# Patient Record
Sex: Female | Born: 1990 | Hispanic: Yes | Marital: Married | Smoking: Never smoker
Health system: Southern US, Community
[De-identification: ages and names within clinical notes are randomized; demographics above are authoritative.]

## PROBLEM LIST (undated history)

## (undated) ENCOUNTER — Inpatient Hospital Stay: Payer: Self-pay

## (undated) DIAGNOSIS — Z8759 Personal history of other complications of pregnancy, childbirth and the puerperium: Secondary | ICD-10-CM

## (undated) DIAGNOSIS — Z789 Other specified health status: Secondary | ICD-10-CM

## (undated) HISTORY — PX: NO PAST SURGERIES: SHX2092

## (undated) HISTORY — DX: Personal history of other complications of pregnancy, childbirth and the puerperium: Z87.59

---

## 2015-05-26 ENCOUNTER — Other Ambulatory Visit: Payer: Self-pay | Admitting: Physician Assistant

## 2015-05-26 DIAGNOSIS — Z369 Encounter for antenatal screening, unspecified: Secondary | ICD-10-CM

## 2015-05-26 LAB — OB RESULTS CONSOLE RUBELLA ANTIBODY, IGM: Rubella: IMMUNE

## 2015-05-26 LAB — OB RESULTS CONSOLE VARICELLA ZOSTER ANTIBODY, IGG: Varicella: IMMUNE

## 2015-05-27 LAB — OB RESULTS CONSOLE VARICELLA ZOSTER ANTIBODY, IGG: VARICELLA IGG: IMMUNE

## 2015-05-27 LAB — OB RESULTS CONSOLE HEPATITIS B SURFACE ANTIGEN: Hepatitis B Surface Ag: NEGATIVE

## 2015-05-27 LAB — OB RESULTS CONSOLE RUBELLA ANTIBODY, IGM: RUBELLA: IMMUNE

## 2015-06-09 ENCOUNTER — Ambulatory Visit
Admission: RE | Admit: 2015-06-09 | Discharge: 2015-06-09 | Disposition: A | Discharge status: Home / Self Care | Payer: Medicaid Other | Source: Ambulatory Visit | Attending: Maternal & Fetal Medicine | Admitting: Maternal & Fetal Medicine

## 2015-06-09 DIAGNOSIS — O209 Hemorrhage in early pregnancy, unspecified: Secondary | ICD-10-CM | POA: Insufficient documentation

## 2015-06-09 DIAGNOSIS — Z36 Encounter for antenatal screening of mother: Secondary | ICD-10-CM

## 2015-06-09 DIAGNOSIS — Z369 Encounter for antenatal screening, unspecified: Secondary | ICD-10-CM

## 2015-06-09 DIAGNOSIS — IMO0002 Reserved for concepts with insufficient information to code with codable children: Secondary | ICD-10-CM

## 2015-06-09 DIAGNOSIS — Z3A18 18 weeks gestation of pregnancy: Secondary | ICD-10-CM | POA: Insufficient documentation

## 2015-06-09 HISTORY — DX: Other specified health status: Z78.9

## 2015-06-09 NOTE — Progress Notes (Addendum)
Pamela Owen Length of Consultation: 30 minutes   Ms. Pamela Owen  was referred to [facility] for genetic counseling to review prenatal screening and testing options.  This note summarizes the information we discussed with the aid of a Spanish interpreter.    We offered the following routine screening tests for this pregnancy:  First trimester screening, which includes nuchal translucency ultrasound screen and first trimester maternal serum marker screening.  The nuchal translucency has approximately an 80% detection rate for Down syndrome and can be positive for other chromosome abnormalities as well as congenital heart defects.  When combined with a maternal serum marker screening, the detection rate is up to 90% for Down syndrome and up to 97% for trisomy 18.     Maternal serum marker screening, a blood test that measures pregnancy proteins, can provide risk assessments for Down syndrome, trisomy 18, and open neural tube defects (spina bifida, anencephaly). Because it does not directly examine the fetus, it cannot positively diagnose or rule out these problems.  Targeted ultrasound uses high frequency sound waves to create an image of the developing fetus.  An ultrasound is often recommended as a routine means of evaluating the pregnancy.  It is also used to screen for fetal anatomy problems (for example, a heart defect) that might be suggestive of a chromosomal or other abnormality.   Should these screening tests indicate an increased concern, then the following additional testing options would be offered:  The chorionic villus sampling procedure is available for first trimester chromosome analysis.  This involves the withdrawal of a small amount of chorionic villi (tissue from the developing placenta).  Risk of pregnancy loss is estimated to be approximately 1 in 200 to 1 in 100 (0.5 to 1%).  There is approximately a 1% (1 in 100) chance that the CVS chromosome  results will be unclear.  Chorionic villi cannot be tested for neural tube defects.     Amniocentesis involves the removal of a small amount of amniotic fluid from the sac surrounding the fetus with the use of a thin needle inserted through the maternal abdomen and uterus.  Ultrasound guidance is used throughout the procedure.  Fetal cells from amniotic fluid are directly evaluated and > 99.5% of chromosome problems and > 98% of open neural tube defects can be detected. This procedure is generally performed after the 15th week of pregnancy.  The main risks to this procedure include complications leading to miscarriage in less than 1 in 200 cases (0.5%).  As another option for information if the pregnancy is suspected to be an an increased chance for certain chromosome conditions, we also reviewed the availability of cell free fetal DNA testing from maternal blood to determine whether or not the baby may have either Down syndrome, trisomy 76, or trisomy 53.  This test utilizes a maternal blood sample and DNA sequencing technology to isolate circulating cell free fetal DNA from maternal plasma.  The fetal DNA can then be analyzed for DNA sequences that are derived from the three most common chromosomes involved in aneuploidy, chromosomes 13, 18, and 21.  If the overall amount of DNA is greater than the expected level for any of these chromosomes, aneuploidy is suspected.  While we do not consider it a replacement for invasive testing and karyotype analysis, a negative result from this testing would be reassuring, though not a guarantee of a normal chromosome complement for the baby.  An abnormal result is certainly suggestive of an  abnormal chromosome complement, though we would still recommend CVS or amniocentesis to confirm any findings from this testing.  We obtained a detailed family history and pregnancy history.  The family history was reported to be unremarkable for birth defects, mental retardation,  recurrent pregnancy loss or known chromosome abnormalities.  Ms. Pamela Owen stated that this is the second pregnancy with her partner, the first ended in early miscarriage.  She reported no complications or exposures in the current pregnancy that would be expected to increase the risk for birth defects.  After consideration of the options, Ms. Pamela Owen elected to proceed with an ultrasound only for dating.  She declined first trimester screening.  An ultrasound was performed at the time of the visit.  Please refer to the ultrasound report for details of that study.  Ms. Pamela Owen was encouraged to call with questions or concerns.  We can be contacted at [phone].   Note:  The patient was seen by Pamela GoodpastureKristin Nunez, MS, CGC on 06/09/2015.  Her genetic counseling note from that date apparently did not get saved in the chart and therefore is documented here with her permission.  Pamela Andersoneborah F. Pamela Verdi, MS, CGC    Reviewed Genetic counseling note with Pamela Owen, agree with assessment and plan as outlined.

## 2015-06-24 ENCOUNTER — Emergency Department: Payer: Medicaid Other

## 2015-06-24 ENCOUNTER — Encounter: Payer: Self-pay | Admitting: Emergency Medicine

## 2015-06-24 ENCOUNTER — Emergency Department
Admission: EM | Admit: 2015-06-24 | Discharge: 2015-06-24 | Disposition: A | Discharge status: Home / Self Care | Payer: Medicaid Other | Attending: Emergency Medicine | Admitting: Emergency Medicine

## 2015-06-24 DIAGNOSIS — Z3A13 13 weeks gestation of pregnancy: Secondary | ICD-10-CM | POA: Diagnosis not present

## 2015-06-24 DIAGNOSIS — O4692 Antepartum hemorrhage, unspecified, second trimester: Secondary | ICD-10-CM

## 2015-06-24 DIAGNOSIS — Z79899 Other long term (current) drug therapy: Secondary | ICD-10-CM | POA: Diagnosis not present

## 2015-06-24 DIAGNOSIS — O209 Hemorrhage in early pregnancy, unspecified: Secondary | ICD-10-CM | POA: Insufficient documentation

## 2015-06-24 DIAGNOSIS — O418X10 Other specified disorders of amniotic fluid and membranes, first trimester, not applicable or unspecified: Secondary | ICD-10-CM | POA: Diagnosis not present

## 2015-06-24 LAB — COMPREHENSIVE METABOLIC PANEL
ALT: 21 U/L (ref 14–54)
AST: 20 U/L (ref 15–41)
Albumin: 4.1 g/dL (ref 3.5–5.0)
Alkaline Phosphatase: 57 U/L (ref 38–126)
Anion gap: 5 (ref 5–15)
BUN: 6 mg/dL (ref 6–20)
CHLORIDE: 105 mmol/L (ref 101–111)
CO2: 25 mmol/L (ref 22–32)
CREATININE: 0.44 mg/dL (ref 0.44–1.00)
Calcium: 9.2 mg/dL (ref 8.9–10.3)
GFR calc non Af Amer: >60 mL/min (ref >60)
Glucose, Bld: 86 mg/dL (ref 65–99)
POTASSIUM: 3.7 mmol/L (ref 3.5–5.1)
SODIUM: 135 mmol/L (ref 135–145)
Total Bilirubin: 1 mg/dL (ref 0.3–1.2)
Total Protein: 7.3 g/dL (ref 6.5–8.1)

## 2015-06-24 LAB — URINALYSIS COMPLETE WITH MICROSCOPIC (ARMC ONLY)
Bilirubin Urine: NEGATIVE
Glucose, UA: NEGATIVE mg/dL
Ketones, ur: NEGATIVE mg/dL
Leukocytes, UA: NEGATIVE
NITRITE: NEGATIVE
PH: 7 (ref 5.0–8.0)
PROTEIN: NEGATIVE mg/dL
SPECIFIC GRAVITY, URINE: 1.005 (ref 1.005–1.030)

## 2015-06-24 LAB — HCG, QUANTITATIVE, PREGNANCY: hCG, Beta Chain, Quant, S: 79538 m[IU]/mL — ABNORMAL HIGH (ref <5)

## 2015-06-24 LAB — CBC
HCT: 35.3 % (ref 35.0–47.0)
Hemoglobin: 12.3 g/dL (ref 12.0–16.0)
MCH: 30.5 pg (ref 26.0–34.0)
MCHC: 34.7 g/dL (ref 32.0–36.0)
MCV: 87.9 fL (ref 80.0–100.0)
PLATELETS: 199 10*3/uL (ref 150–440)
RBC: 4.02 MIL/uL (ref 3.80–5.20)
RDW: 12.6 % (ref 11.5–14.5)
WBC: 10.7 10*3/uL (ref 3.6–11.0)

## 2015-06-24 LAB — ABO/RH: ABO/RH(D): O POS

## 2015-06-24 NOTE — ED Notes (Signed)
Pt returned from US

## 2015-06-24 NOTE — ED Provider Notes (Signed)
[facility] Emergency Department Provider Note  ____________________________________________  Time seen: 3:30 AM  I have reviewed the triage vital signs and the nursing notes.   HISTORY  Chief Complaint Vaginal Bleeding    HPI Pamela Owen Denny Levy is a 24 y.o. female G2 P0 1 previous miscarriage approximately [redacted] weeks pregnant presents with acute onset of vaginal bleeding times "couple hours". He states that she's use one pad in the past hour. Patient also admits to pelvic cramping. Patient denies any fever no nausea or vomiting.     Past Medical History  Diagnosis Date  . Medical history non-contributory     There are no active problems to display for this patient.   Past Surgical History  Procedure Laterality Date  . No past surgeries      Current Outpatient Rx  Name  Route  Sig  Dispense  Refill  . Prenatal Vit-Fe Fumarate-FA (PRENATAL MULTIVITAMIN) TABS tablet   Oral   Take 1 tablet by mouth daily at 12 noon.           Allergies Other  History reviewed. No pertinent family history.  Social History Social History  Substance Use Topics  . Smoking status: Never Smoker   . Smokeless tobacco: Never Used  . Alcohol Use: No    Review of Systems  Constitutional: Negative for fever. Eyes: Negative for visual changes. ENT: Negative for sore throat. Cardiovascular: Negative for chest pain. Respiratory: Negative for shortness of breath. Gastrointestinal: Negative for abdominal pain, vomiting and diarrhea. Genitourinary: Negative for dysuria. Positive for pelvic pain and vaginal bleeding Musculoskeletal: Negative for back pain. Skin: Negative for rash. Neurological: Negative for headaches, focal weakness or numbness.  10-point ROS otherwise negative.  ____________________________________________   PHYSICAL EXAM:  VITAL SIGNS: ED Triage Vitals  Enc Vitals Group     BP 06/24/15 0251 108/65 mmHg     Pulse Rate  06/24/15 0251 84     Resp 06/24/15 0251 20     Temp 06/24/15 0251 98.6 F (37 C)     Temp Source 06/24/15 0251 Oral     SpO2 06/24/15 0251 100 %     Weight 06/24/15 0251 145 lb (65.772 kg)     Height 06/24/15 0251  (1.651 m)     Head Cir --      Peak Flow --      Pain Score 06/24/15 0253 0     Pain Loc --      Pain Edu? --      Excl. in GC? --      Constitutional: Alert and oriented. Well appearing and in no distress. Eyes: Conjunctivae are normal. PERRL. Normal extraocular movements. ENT   Head: Normocephalic and atraumatic.   Nose: No congestion/rhinnorhea.   Mouth/Throat: Mucous membranes are moist.   Neck: No stridor. Hematological/Lymphatic/Immunilogical: No cervical lymphadenopathy. Cardiovascular: Normal rate, regular rhythm. Normal and symmetric distal pulses are present in all extremities. No murmurs, rubs, or gallops. Respiratory: Normal respiratory effort without tachypnea nor retractions. Breath sounds are clear and equal bilaterally. No wheezes/rales/rhonchi. Gastrointestinal: Soft and nontender. No distention. There is no CVA tenderness. Genitourinary: deferred Musculoskeletal: Nontender with normal range of motion in all extremities. No joint effusions.  No lower extremity tenderness nor edema. Neurologic:  Normal speech and language. No gross focal neurologic deficits are appreciated. Speech is normal.  Skin:  Skin is warm, dry and intact. No rash noted. Psychiatric: Mood and affect are normal. Speech and behavior are normal. Patient exhibits  appropriate insight and judgment.  ____________________________________________    LABS (pertinent positives/negatives)  Labs Reviewed  HCG, QUANTITATIVE, PREGNANCY - Abnormal; Notable for the following:    hCG, Beta Chain, [place] LongestQuant, S [id number] (*)    All other components within normal limits  URINALYSIS COMPLETEWITH MICROSCOPIC ([facility] ONLY) - Abnormal; Notable for the following:    Color, Urine STRAW (*)     APPearance CLEAR (*)    Hgb urine dipstick 3+ (*)    Bacteria, UA RARE (*)    Squamous Epithelial / LPF 0-5 (*)    All other components within normal limits  CBC  COMPREHENSIVE METABOLIC PANEL  ABO/RH         RADIOLOGY  US OB Comp Less 14 Wks (Final result) Result time: 06/24/15 05:58:27   Final result by Rad Results In Interface (06/24/15 05:58:27)   Narrative:   CLINICAL DATA: Acute onset of vaginal bleeding and pelvic pain. Initial encounter.  EXAM: OBSTETRIC <14 WK ULTRASOUND  TECHNIQUE: Transabdominal ultrasound was performed for evaluation of the gestation as well as the maternal uterus and adnexal regions.  COMPARISON: Pelvic ultrasound performed 06/09/2015  FINDINGS: Intrauterine gestational sac: Visualized/normal in shape.  Yolk sac: Yes  Embryo: Yes  Cardiac Activity: Yes  Heart Rate: 160 bpm  CRL:  7.53 cm  13 w 4 d         US EDC: 12/26/2015  Subchorionic hemorrhage: A small amount of subchorionic hemorrhage is noted.  Maternal uterus/adnexae: The uterus is otherwise unremarkable in appearance.  The ovaries are within normal limits. The right ovary measures 3.3 x 2.2 x 2.4 cm, while the left ovary measures 2.8 x 1.5 x 1.6 cm. No suspicious adnexal masses are seen; there is no evidence for ovarian torsion.  No free fluid is seen within the pelvic cul-de-sac.  IMPRESSION: 1. Single live intrauterine pregnancy noted, with a crown-rump length of 7.5 cm, corresponding to a gestational age of [redacted] weeks 4 days. This matches the gestational age of [redacted] weeks 5 days by the first ultrasound, reflecting an estimated date of delivery of December 25, 2015. 2. Small amount of subchorionic hemorrhage noted.   Electronically Signed By: Roanna RaiderJeffery Chang M.D. On: 06/24/2015 05:58        ECG Results        INITIAL IMPRESSION / ASSESSMENT AND PLAN / ED COURSE  Pertinent labs & imaging results that were available during my care  of the patient were reviewed by me and considered in my medical decision making (see chart for details).    ____________________________________________   FINAL CLINICAL IMPRESSION(S) / ED DIAGNOSES  Final diagnoses:  Vaginal bleeding in pregnancy, second trimester  Subchorionic Hemorrhage    Darci Currentandolph N Linus Weckerly, MD 06/28/15 [phone]

## 2015-06-24 NOTE — ED Notes (Signed)
Woke up tonight with vaginal bleeding. Is [redacted] weeks pregnant. States she is still bleeding. Unable to say how many pads.

## 2015-06-24 NOTE — ED Notes (Signed)
Pt taken to US

## 2015-06-24 NOTE — Discharge Instructions (Signed)
Hemorragia vaginal durante el embarazo (segundo trimestre) (Vaginal Bleeding During Pregnancy, Second Trimester) Durante el embarazo es relativamente frecuente que se presente una pequea hemorragia (manchas). Esta situacin generalmente mejora por s misma. Hay diversos factores que pueden causar hemorragia o manchas en el embarazo. Algunas manchas pueden estar relacionadas al embarazo y otras no. A veces, la hemorragia es normal y no es un problema. Sin embargo, la hemorragia tambin puede ser un signo de algo grave. Debe informar a su mdico de inmediato si tiene alguna hemorragia vaginal. Algunas causas posibles de hemorragia vaginal durante el segundo trimestre incluyen:  Infeccin, inflamacin o crecimientos anormales en el cuello del tero.  La placenta cubre parcial o completamente la abertura del cuello del tero (placenta previa).  La placenta puede haberse separado del tero (abrupcin placentaria).  Usted puede estar sufriendo un trabajo de parto prematuro.  Es probable que el cuello del tero no sea lo suficientemente resistente para mantener un beb dentro del tero (insuficiencia cervical).  Se pueden haber desarrollado pequeos quistes en el tero en lugar de tejido de embarazo (embarazo molar). INSTRUCCIONES PARA EL CUIDADO EN EL HOGAR  Controle su afeccin para ver si hay cambios. Las siguientes indicaciones ayudarn a aliviar cualquier molestia que pueda sentir:  Siga las indicaciones del mdico para restringir su actividad. Si el mdico le indica descanso en la cama, debe quedarse en la cama y levantarse solo para ir al bao. No obstante, el mdico puede permitirle que continu con tareas livianas.  Si es necesario, organcese para que alguien le ayude con las actividades y responsabilidades cotidianas mientras est en cama.  Lleve un registro de la cantidad y la saturacin de las toallas higinicas que utiliza cada da. Anote este dato.  No use tampones.No se haga duchas  vaginales.  No tenga relaciones sexuales u orgasmos hasta que el mdico la autorice.  Si elimina tejido por la vagina, gurdelo para mostrrselo al mdico.  Tome solo medicamentos de venta libre o recetados, segn las indicaciones del mdico.  No tome aspirina, ya que puede causar hemorragias.  No realice ejercicio ni ninguna actividad intensa, ni levante objetos pesados sin el permiso de su mdico.  Cumpla con todas las visitas de control, segn le indique su mdico. SOLICITE ATENCIN MDICA SI:  Tiene un sangrado vaginal en cualquier momento del embarazo.  Tiene calambres o dolores de parto.  Tiene fiebre que los medicamentos no pueden controlar. SOLICITE ATENCIN MDICA DE INMEDIATO SI:   Siente calambres intensos en la espalda o en el vientre (abdomen).  Siente contracciones.  Tiene escalofros.  Elimina cogulos grandes o tejido por la vagina.  La hemorragia aumenta.  Si se siente mareada, dbil o se desmaya.  Tiene una prdida importante o sale lquido a borbotones por la vagina. ASEGRESE DE QUE:  Comprende estas instrucciones.  Controlar su afeccin.  Recibir ayuda de inmediato si no mejora o si empeora.   Esta informacin no tiene como fin reemplazar el consejo del mdico. Asegrese de hacerle al mdico cualquier pregunta que tenga.   Document Released: 03/21/2005 Document Revised: 06/16/2013 Elsevier Interactive Patient Education 2016 Elsevier Inc.  

## 2015-06-24 NOTE — ED Notes (Addendum)
Pt reports vaginal bleeding x couple hours.  Pt reports being [redacted] weeks pregnant.  Pt reports pain in lower abd rated 2/10, unable to described.  Pt reports pain worse with movement, "like the baby is moving".  Pt reports using one pad in the past hour.  Pt reports HA, but no dizziness or lightheadedness.  Pt reports second pregnancy, first resulted in miscarriage.  Pt NAD at this time.    Assessment completed with help of tele-interpreter, mario.

## 2015-06-24 NOTE — ED Notes (Signed)
Discharge instructions reviewed with help of tele-interpreter, Milderd Meagerriana.

## 2015-06-26 NOTE — L&D Delivery Note (Signed)
Delivery Note At 4:26 PM a viable  female was delivered via Vaginal, Spontaneous Delivery (Presentation: OA;  ).  APGAR: 8, 9; weight  .   Placenta status: Intact, Spontaneous. See notes regarding placenta Cord: 3 vessels with the following complications: .    Anesthesia: Epidural  Episiotomy: None Lacerations:  2nd degree lac Suture Repair: 3.0 chromic Est. Blood Loss (mL):    Mom to postpartum.  Baby to Couplet care / Skin to Skin.  Sharee PimpleCaron W Jones 01/03/2016, 5:13 PM

## 2015-07-01 MED ORDER — FAMOTIDINE IN NACL 20-0.9 MG/50ML-% IV SOLN
INTRAVENOUS | Status: AC
  Filled 2015-07-01: qty 50

## 2015-07-01 MED ORDER — METHYLPREDNISOLONE SODIUM SUCC 125 MG IJ SOLR
INTRAMUSCULAR | Status: AC
  Filled 2015-07-01: qty 2

## 2015-07-25 ENCOUNTER — Ambulatory Visit
Admission: RE | Admit: 2015-07-25 | Discharge: 2015-07-25 | Disposition: A | Discharge status: Home / Self Care | Payer: Self-pay | Source: Ambulatory Visit | Attending: Obstetrics & Gynecology | Admitting: Obstetrics & Gynecology

## 2015-07-25 ENCOUNTER — Other Ambulatory Visit: Payer: Self-pay

## 2015-07-25 ENCOUNTER — Other Ambulatory Visit: Payer: Self-pay | Admitting: Maternal & Fetal Medicine

## 2015-07-25 DIAGNOSIS — O359XX0 Maternal care for (suspected) fetal abnormality and damage, unspecified, not applicable or unspecified: Secondary | ICD-10-CM

## 2015-07-25 NOTE — Addendum Note (Signed)
Encounter addended by: Katrina Stack on: 07/25/2015 11:36 AM<BR>     Documentation filed: Notes Section

## 2015-10-06 LAB — OB RESULTS CONSOLE HIV ANTIBODY (ROUTINE TESTING): HIV: NONREACTIVE

## 2015-10-07 LAB — OB RESULTS CONSOLE RPR: RPR: NONREACTIVE

## 2015-12-03 LAB — OB RESULTS CONSOLE GBS: STREP GROUP B AG: POSITIVE

## 2016-01-01 ENCOUNTER — Observation Stay
Admission: EM | Admit: 2016-01-01 | Discharge: 2016-01-01 | Disposition: A | Discharge status: Home / Self Care | Payer: MEDICAID | Attending: Obstetrics & Gynecology | Admitting: Obstetrics & Gynecology

## 2016-01-01 DIAGNOSIS — O471 False labor at or after 37 completed weeks of gestation: Principal | ICD-10-CM | POA: Insufficient documentation

## 2016-01-01 DIAGNOSIS — Z91018 Allergy to other foods: Secondary | ICD-10-CM | POA: Insufficient documentation

## 2016-01-01 DIAGNOSIS — Z3A41 41 weeks gestation of pregnancy: Secondary | ICD-10-CM | POA: Insufficient documentation

## 2016-01-01 NOTE — Discharge Summary (Addendum)
Pamela Owen is a 25 y.o. female. She is at 3555w0d gestation.  Chief Complaint: contractions  S: Resting comfortably. mild CTX, scant VB.no LOF,  Active fetal movement.   Contractions began 4 days ago and have been gaining in frequency and strength, she rates them as 8/10 currently.  The occur every 5 minute or so, and last 45 seconds.  She has some mucousy discharge.    Maternal Medical History:   Past Medical History  Diagnosis Date  . Medical history non-contributory     Past Surgical History  Procedure Laterality Date  . No past surgeries      Allergies  Allergen Reactions  . Other Itching    Spicy foods, specifically chilles    Prior to Admission medications   Medication Sig Start Date End Date Taking? Authorizing Provider  Prenatal Vit-Fe Fumarate-FA (PRENATAL MULTIVITAMIN) TABS tablet Take 1 tablet by mouth daily at 12 noon.    Historical Provider, MD     Pren[facility]  Social History: She  reports that she has never smoked. She has never used smokeless tobacco. She reports that she does not drink alcohol or use illicit drugs.  Family History: family history is not on file.   Review of Systems: A full review of systems was performed and negative except as noted in the HPI.     O:  BP 129/77 mmHg  Pulse 92  Temp(Src) 98 F (36.7 C) (Oral)  Resp 16  Ht 5' 6.93" (1.7 m)  Wt 81.647 kg (180 lb)  BMI 28.25 kg/m2  LMP  (LMP Unknown) No results found for this or any previous visit (from the past 48 hour(s)).   Constitutional: NAD, AAOx3  HE/ENT: extraocular movements grossly intact, moist mucous membranes CV: RRR PULM: nl respiratory effort, CTABL     Abd: gravid, non-tender, non-distended, soft      Ext: Non-tender, Nonedmeatous   Psych: mood appropriate, speech normal Pelvic: 1/80/-3. Posterior, soft  FHT: 135 mod + accels no decels TOCO: quiet    A/P:  25yo G2P0010 @ 41.0 with rule out labor.    Labor: not present.   Fetal Wellbeing: Reassuring Cat 1 tracing.  NST reactive  D/c home stable, precautions reviewed, scheduled for IOL tomorrow night.  Return then unless labor occurs.  ----- Ranae Plumberhelsea Ward, MD Attending Obstetrician and Gynecologist [facility]

## 2016-01-01 NOTE — OB Triage Note (Signed)
Ms. Pamela Owen here with c/o ctx that began Friday, became stronger today, reports ctx approx 3 per hour, reports blood in her discharge, positive fetal movement

## 2016-01-02 ENCOUNTER — Inpatient Hospital Stay
Admission: EM | Admit: 2016-01-02 | Discharge: 2016-01-05 | DRG: 775 | Disposition: A | Discharge status: Home / Self Care | Payer: Medicaid Other | Attending: Obstetrics and Gynecology | Admitting: Obstetrics and Gynecology

## 2016-01-02 DIAGNOSIS — O480 Post-term pregnancy: Principal | ICD-10-CM | POA: Diagnosis present

## 2016-01-02 DIAGNOSIS — O99824 Streptococcus B carrier state complicating childbirth: Secondary | ICD-10-CM | POA: Diagnosis present

## 2016-01-02 DIAGNOSIS — Z3A41 41 weeks gestation of pregnancy: Secondary | ICD-10-CM | POA: Diagnosis not present

## 2016-01-02 DIAGNOSIS — Z23 Encounter for immunization: Secondary | ICD-10-CM | POA: Diagnosis not present

## 2016-01-02 DIAGNOSIS — Z3493 Encounter for supervision of normal pregnancy, unspecified, third trimester: Secondary | ICD-10-CM

## 2016-01-02 LAB — CHLAMYDIA/NGC RT PCR (ARMC ONLY)
Chlamydia Tr: NOT DETECTED
N GONORRHOEAE: NOT DETECTED

## 2016-01-02 LAB — CBC
HCT: 38.5 % (ref 35.0–47.0)
Hemoglobin: 13.6 g/dL (ref 12.0–16.0)
MCH: 31.8 pg (ref 26.0–34.0)
MCHC: 35.4 g/dL (ref 32.0–36.0)
MCV: 89.8 fL (ref 80.0–100.0)
PLATELETS: 228 10*3/uL (ref 150–440)
RBC: 4.28 MIL/uL (ref 3.80–5.20)
RDW: 13.6 % (ref 11.5–14.5)
WBC: 15.8 10*3/uL — AB (ref 3.6–11.0)

## 2016-01-02 LAB — OB RESULTS CONSOLE GC/CHLAMYDIA
CHLAMYDIA, DNA PROBE: NEGATIVE
Gonorrhea: NEGATIVE

## 2016-01-02 LAB — TYPE AND SCREEN
ABO/RH(D): O POS
Antibody Screen: NEGATIVE

## 2016-01-02 LAB — RAPID HIV SCREEN (HIV 1/2 AB+AG)
HIV 1/2 Antibodies: NONREACTIVE
HIV-1 P24 Antigen - HIV24: NONREACTIVE

## 2016-01-02 MED ORDER — ONDANSETRON HCL 4 MG/2ML IJ SOLN: 4.0000 mg | Freq: Four times a day (QID) | INTRAMUSCULAR | Status: DC | PRN

## 2016-01-02 MED ORDER — LIDOCAINE HCL (PF) 1 % IJ SOLN
INTRAMUSCULAR | Status: AC
  Administered 2016-01-03: 30 mL via SUBCUTANEOUS
  Filled 2016-01-02: qty 30

## 2016-01-02 MED ORDER — BUTORPHANOL TARTRATE 1 MG/ML IJ SOLN
1.0000 mg | INTRAMUSCULAR | Status: DC | PRN
  Administered 2016-01-03 (×2): 1 mg via INTRAVENOUS
  Filled 2016-01-02 (×2): qty 1

## 2016-01-02 MED ORDER — SOD CITRATE-CITRIC ACID 500-334 MG/5ML PO SOLN: 30.0000 mL | ORAL | Status: DC | PRN

## 2016-01-02 MED ORDER — OXYCODONE-ACETAMINOPHEN 5-325 MG PO TABS
1.0000 | ORAL_TABLET | ORAL | Status: DC | PRN
  Administered 2016-01-03: 1 via ORAL
  Filled 2016-01-02: qty 1

## 2016-01-02 MED ORDER — OXYTOCIN 40 UNITS IN LACTATED RINGERS INFUSION - SIMPLE MED
2.5000 [IU]/h | INTRAVENOUS | Status: DC
  Administered 2016-01-03: 2.5 [IU]/h via INTRAVENOUS

## 2016-01-02 MED ORDER — OXYTOCIN 10 UNIT/ML IJ SOLN
INTRAMUSCULAR | Status: AC
  Filled 2016-01-02: qty 2

## 2016-01-02 MED ORDER — PENICILLIN G POTASSIUM 5000000 UNITS IJ SOLR
5.0000 10*6.[IU] | Freq: Once | INTRAVENOUS | Status: AC
  Administered 2016-01-03: 5 10*6.[IU] via INTRAVENOUS
  Filled 2016-01-02: qty 5

## 2016-01-02 MED ORDER — OXYCODONE-ACETAMINOPHEN 5-325 MG PO TABS: 2.0000 | ORAL_TABLET | ORAL | Status: DC | PRN

## 2016-01-02 MED ORDER — LACTATED RINGERS IV SOLN
INTRAVENOUS | Status: DC
  Administered 2016-01-02 – 2016-01-03 (×4): via INTRAVENOUS

## 2016-01-02 MED ORDER — LACTATED RINGERS IV SOLN: 500.0000 mL | INTRAVENOUS | Status: DC | PRN

## 2016-01-02 MED ORDER — AMMONIA AROMATIC IN INHA
RESPIRATORY_TRACT | Status: AC
  Filled 2016-01-02: qty 10

## 2016-01-02 MED ORDER — ACETAMINOPHEN 325 MG PO TABS: 650.0000 mg | ORAL_TABLET | ORAL | Status: DC | PRN

## 2016-01-02 MED ORDER — MISOPROSTOL 200 MCG PO TABS
ORAL_TABLET | ORAL | Status: AC
  Filled 2016-01-02: qty 4

## 2016-01-02 MED ORDER — PENICILLIN G POTASSIUM 5000000 UNITS IJ SOLR
2.5000 10*6.[IU] | INTRAVENOUS | Status: DC
  Administered 2016-01-03: 2.5 10*6.[IU] via INTRAVENOUS
  Filled 2016-01-02 (×10): qty 2.5

## 2016-01-02 MED ORDER — OXYTOCIN 40 UNITS IN LACTATED RINGERS INFUSION - SIMPLE MED
1.0000 m[IU]/min | INTRAVENOUS | Status: DC
  Administered 2016-01-02: 1 m[IU]/min via INTRAVENOUS
  Filled 2016-01-02: qty 1000

## 2016-01-02 MED ORDER — LIDOCAINE HCL (PF) 1 % IJ SOLN
30.0000 mL | INTRAMUSCULAR | Status: DC | PRN
  Administered 2016-01-03: 30 mL via SUBCUTANEOUS

## 2016-01-02 MED ORDER — OXYTOCIN BOLUS FROM INFUSION
500.0000 mL | INTRAVENOUS | Status: DC
  Administered 2016-01-03: 500 mL via INTRAVENOUS

## 2016-01-03 ENCOUNTER — Inpatient Hospital Stay: Payer: Medicaid Other | Admitting: Anesthesiology

## 2016-01-03 ENCOUNTER — Encounter: Payer: Self-pay | Admitting: Anesthesiology

## 2016-01-03 MED ORDER — MEASLES, MUMPS & RUBELLA VAC ~~LOC~~ INJ: 0.5000 mL | INJECTION | Freq: Once | SUBCUTANEOUS | Status: AC

## 2016-01-03 MED ORDER — SODIUM CHLORIDE 0.9 % IV SOLN
INTRAVENOUS | Status: DC | PRN
  Administered 2016-01-03 (×2): 5 mL via EPIDURAL

## 2016-01-03 MED ORDER — SODIUM CHLORIDE 0.9 % IV SOLN: 250.0000 mL | INTRAVENOUS | Status: DC | PRN

## 2016-01-03 MED ORDER — DIPHENHYDRAMINE HCL 50 MG/ML IJ SOLN: 12.5000 mg | INTRAMUSCULAR | Status: DC | PRN

## 2016-01-03 MED ORDER — ONDANSETRON HCL 4 MG/2ML IJ SOLN: 4.0000 mg | Freq: Three times a day (TID) | INTRAMUSCULAR | Status: DC | PRN

## 2016-01-03 MED ORDER — SODIUM CHLORIDE 0.9% FLUSH: 3.0000 mL | INTRAVENOUS | Status: DC | PRN

## 2016-01-03 MED ORDER — ACETAMINOPHEN 325 MG PO TABS: 650.0000 mg | ORAL_TABLET | ORAL | Status: DC | PRN

## 2016-01-03 MED ORDER — NALBUPHINE HCL 10 MG/ML IJ SOLN: 5.0000 mg | Freq: Once | INTRAMUSCULAR | Status: DC | PRN

## 2016-01-03 MED ORDER — TETANUS-DIPHTH-ACELL PERTUSSIS 5-2.5-18.5 LF-MCG/0.5 IM SUSP: 0.5000 mL | Freq: Once | INTRAMUSCULAR | Status: AC

## 2016-01-03 MED ORDER — KETOROLAC TROMETHAMINE 30 MG/ML IJ SOLN: 30.0000 mg | Freq: Four times a day (QID) | INTRAMUSCULAR | Status: DC | PRN

## 2016-01-03 MED ORDER — ZOLPIDEM TARTRATE 5 MG PO TABS: 5.0000 mg | ORAL_TABLET | Freq: Every evening | ORAL | Status: DC | PRN

## 2016-01-03 MED ORDER — ONDANSETRON HCL 4 MG PO TABS: 4.0000 mg | ORAL_TABLET | ORAL | Status: DC | PRN

## 2016-01-03 MED ORDER — BISACODYL 10 MG RE SUPP: 10.0000 mg | Freq: Every day | RECTAL | Status: DC | PRN

## 2016-01-03 MED ORDER — MEPERIDINE HCL 25 MG/ML IJ SOLN: 6.2500 mg | INTRAMUSCULAR | Status: DC | PRN

## 2016-01-03 MED ORDER — IBUPROFEN 600 MG PO TABS
600.0000 mg | ORAL_TABLET | Freq: Four times a day (QID) | ORAL | Status: DC
  Administered 2016-01-03 – 2016-01-04 (×4): 600 mg via ORAL
  Filled 2016-01-03 (×5): qty 1

## 2016-01-03 MED ORDER — BENZOCAINE-MENTHOL 20-0.5 % EX AERO: 1.0000 "application " | INHALATION_SPRAY | CUTANEOUS | Status: DC | PRN

## 2016-01-03 MED ORDER — NALBUPHINE HCL 10 MG/ML IJ SOLN: 5.0000 mg | INTRAMUSCULAR | Status: DC | PRN

## 2016-01-03 MED ORDER — SODIUM CHLORIDE FLUSH 0.9 % IV SOLN
INTRAVENOUS | Status: AC
  Filled 2016-01-03: qty 30

## 2016-01-03 MED ORDER — FENTANYL 2.5 MCG/ML W/ROPIVACAINE 0.2% IN NS 100 ML EPIDURAL INFUSION (ARMC-ANES)
EPIDURAL | Status: AC
  Administered 2016-01-03: 10 mL/h via EPIDURAL
  Filled 2016-01-03: qty 100

## 2016-01-03 MED ORDER — LIDOCAINE-EPINEPHRINE (PF) 1.5 %-1:200000 IJ SOLN
INTRAMUSCULAR | Status: DC | PRN
  Administered 2016-01-03: 3 mL via EPIDURAL

## 2016-01-03 MED ORDER — SENNOSIDES-DOCUSATE SODIUM 8.6-50 MG PO TABS
2.0000 | ORAL_TABLET | ORAL | Status: DC
  Administered 2016-01-04 (×2): 2 via ORAL
  Filled 2016-01-03 (×2): qty 2

## 2016-01-03 MED ORDER — KETOROLAC TROMETHAMINE 30 MG/ML IJ SOLN
30.0000 mg | Freq: Four times a day (QID) | INTRAMUSCULAR | Status: DC | PRN
Start: 2016-01-03 — End: 2016-01-04

## 2016-01-03 MED ORDER — DIBUCAINE 1 % RE OINT: 1.0000 "application " | TOPICAL_OINTMENT | RECTAL | Status: DC | PRN

## 2016-01-03 MED ORDER — FLEET ENEMA 7-19 GM/118ML RE ENEM: 1.0000 | ENEMA | Freq: Every day | RECTAL | Status: DC | PRN

## 2016-01-03 MED ORDER — FENTANYL 2.5 MCG/ML W/ROPIVACAINE 0.2% IN NS 100 ML EPIDURAL INFUSION (ARMC-ANES)
10.0000 mL/h | EPIDURAL | Status: DC
  Administered 2016-01-03: 10 mL/h via EPIDURAL

## 2016-01-03 MED ORDER — NALOXONE HCL 2 MG/2ML IJ SOSY
1.0000 ug/kg/h | PREFILLED_SYRINGE | INTRAVENOUS | Status: DC | PRN
  Filled 2016-01-03: qty 2

## 2016-01-03 MED ORDER — LIDOCAINE HCL (PF) 1 % IJ SOLN
INTRAMUSCULAR | Status: DC | PRN
  Administered 2016-01-03: 3 mL via SUBCUTANEOUS

## 2016-01-03 MED ORDER — ONDANSETRON HCL 4 MG/2ML IJ SOLN: 4.0000 mg | INTRAMUSCULAR | Status: DC | PRN

## 2016-01-03 MED ORDER — WITCH HAZEL-GLYCERIN EX PADS: 1.0000 "application " | MEDICATED_PAD | CUTANEOUS | Status: DC | PRN

## 2016-01-03 MED ORDER — COCONUT OIL OIL
1.0000 "application " | TOPICAL_OIL | Status: DC | PRN
  Administered 2016-01-04: 1 via TOPICAL
  Filled 2016-01-03: qty 120

## 2016-01-03 MED ORDER — DIPHENHYDRAMINE HCL 25 MG PO CAPS: 25.0000 mg | ORAL_CAPSULE | ORAL | Status: DC | PRN

## 2016-01-03 MED ORDER — OXYCODONE HCL 5 MG PO TABS: 5.0000 mg | ORAL_TABLET | ORAL | Status: DC | PRN

## 2016-01-03 MED ORDER — SIMETHICONE 80 MG PO CHEW: 80.0000 mg | CHEWABLE_TABLET | ORAL | Status: DC | PRN

## 2016-01-03 MED ORDER — PRENATAL MULTIVITAMIN CH
1.0000 | ORAL_TABLET | Freq: Every day | ORAL | Status: DC
  Administered 2016-01-04: 1 via ORAL
  Filled 2016-01-03: qty 1

## 2016-01-03 MED ORDER — SODIUM CHLORIDE 0.9% FLUSH: 3.0000 mL | Freq: Two times a day (BID) | INTRAVENOUS | Status: DC

## 2016-01-03 MED ORDER — NALOXONE HCL 0.4 MG/ML IJ SOLN: 0.4000 mg | INTRAMUSCULAR | Status: DC | PRN

## 2016-01-03 NOTE — Progress Notes (Signed)
S: Resting comfortably.. + CTX, no LOF, VB O: Filed Vitals:   01/03/16 0719 01/03/16 0724 01/03/16 0729 01/03/16 0734  BP:      Pulse:      Temp:      TempSrc:      Resp:      Height:      Weight:      SpO2: 99% 99% 99% 100%     Gen: NAD, AAOx3      Abd: FNTTP      Ext: Non-tender, Nonedmeatous    FHT: + mod var + accelerations no decelerations TOCO: Q 2-21/2 min SVE: 5/80/vtx   A/P:  25 y.o. yo G2P0010 at 2786w2d for IOL with PItocin   Labor: progressing  FWB: Reassuring Cat 1 tracing.   GBS: pos (Antibiotics started)    Sharee Pimplearon W Jones 8:10 AM

## 2016-01-03 NOTE — Progress Notes (Signed)
S: Resting comfortably with epidural. + CTX, no LOF, VB O: Filed Vitals:   01/03/16 0907 01/03/16 1007 01/03/16 1107 01/03/16 1207  BP: 107/66 114/65 109/74 110/70  Pulse: 82 84 77 75  Temp:      TempSrc:      Resp:  18 18   Height:      Weight:      SpO2:         Gen: NAD, AAOx3      Abd: FNTTP      Ext: Non-tender, Nonedmeatous    FHT:+ mod var + accelerations, early decelerations TOCO: Q2-2.5 min SVE: 7/90/vtx-1   A/P:  25 y.o. yo G2P0010 at 3943w2d for post-dates IOL.   Labor: progressing  FWB: Reassuring Cat 1 tracing.   GBS:pos  P: Continue Pitocin drip at 6 mun/min and increase per protocol  Continue fetal and uterine monitoring  Monitor vital signs.  Antic SVD  Sharee Pimplearon W Linnae Rasool 12:49 PM

## 2016-01-03 NOTE — Progress Notes (Signed)
VAGINAL DELIVERY NOTE:  Date of Delivery: 01/03/2016 Primary OB: [facility]  Gestational Age/EDD: 7749w2d 12/25/2015, by Ultrasound Antepartum complications: GBS pos and post-dates IOL Attending Physician: Milon ScoreJones, Briseyda Fehr CNM Delivery Type: spontaneous vaginal delivery  Anesthesia: epidural, local 1% xylocaine for repair Laceration: 2nd degree perineal Episiotomy: none Placenta: spontaneous, there is an abnormality of the cord insertion where it appears to be a velamentous partial cord. One side is exposed with blood vessels in a velamentous position and the other side of the cord is attached like normal. To pathology.  Intrapartum complications: lt meconium seen on ruptured, variable decels, CAN x 1 reduced and then released from Rt arm at delivery Estimated Blood Loss: 300 ml GBS: pos, with antibiotics per protocol Procedure Details: NSVD of viable female in straight OA position with CAN x 1, delivered through the cord. Vtx, anto and post shoulders del with the body without delay.   Baby: Liveborn female, Apgars , weight  #,  oz, baby named Derek MoundRicardo

## 2016-01-03 NOTE — Progress Notes (Signed)
S: Resting comfortably withepidural. + CTX, no LOF, VB O: Filed Vitals:   01/03/16 1207 01/03/16 1215 01/03/16 1307 01/03/16 1356  BP: 110/70  112/68   Pulse: 75  81   Temp:  98.1 F (36.7 C)  98.2 F (36.8 C)  TempSrc:  Oral  Oral  Resp: 18  18   Height:      Weight:      SpO2:       Pushed with UC and baby had a variable so will labor down.   Gen: NAD, AAOx3      Abd: FNTTP      Ext: Non-tender, Nonedmeatous    FHT: + mod var + accelerations no decelerations TOCO: Q2-2.5 min SVE: ant lip   A/P:  25 y.o. yo G2P0010 at 7177w2d for post-dates.   Labor: reassuring strip, on Pitocin making progress  FWB: Reassuring Cat 1 tracing. EFW 6#12oz  GBS: pos   Sharee Pimplearon W Olumide Dolinger 2:37 PM

## 2016-01-03 NOTE — H&P (Signed)
HISTORY AND PHYSICAL Late Entry as H&P not completed on admission  HISTORY OF PRESENT ILLNESS: Ms. Pamela Owen is a 25 y.o. G2P0010 at 3944w2d by LMP consistent with 11 4/7 week ultrasound with a pregnancy complicated by 1st trimester bleeding due to subchorionic hemorrhage presenting for induction of labor. Pt attends [facility] for OB care.   She has Not been having contractions and denies leakage of fluid, vaginal bleeding, or decreased fetal movement.   Pt is Hispanic speaking. Interpreter called.   REVIEW OF SYSTEMS: A complete review of systems was performed and was specifically negative for headache, changes in vision, RUQ pain, shortness of breath, chest pain, lower extremity edema and dysuria.   HISTORY:  Past Medical History  Diagnosis Date  . Medical history non-contributory     Past Surgical History  Procedure Laterality Date  . No past surgeries      No current facility-administered medications on file prior to encounter.   Current Outpatient Prescriptions on File Prior to Encounter  Medication Sig Dispense Refill  . Prenatal Vit-Fe Fumarate-FA (PRENATAL MULTIVITAMIN) TABS tablet Take 1 tablet by mouth daily at 12 noon.       Allergies  Allergen Reactions  . Other Itching    Spicy foods, specifically chilles    OB History  Gravida Para Term Preterm AB SAB TAB Ectopic Multiple Living  2    1 1         # Outcome Date GA Lbr Len/2nd Weight Sex Delivery Anes PTL Lv  2 Current           1 SAB 02/2015              Gynecologic History:none History of Abnormal Pap Smear: not known History of STI: neg  Social History  Substance Use Topics  . Smoking status: Never Smoker   . Smokeless tobacco: Never Used  . Alcohol Use: No    PHYSICAL EXAM: Temp:  [97.7 F (36.5 C)-98.5 F (36.9 C)] 98.5 F (36.9 C) (07/11 1614) Pulse Rate:  [72-120] 110 (07/11 1802) Resp:  [18-19] 18 (07/11 1307) BP: (86-124)/(41-90) 101/71 mmHg (07/11 1802) SpO2:  [99 %-100 %] 100 %  (07/11 0734) Weight:  [180 lb (81.647 kg)] 180 lb (81.647 kg) (07/10 2044)  GENERAL: NAD AAOx3 CHEST:CTAB no increased work of breathing CV:RRR no appreciable murmurs, rubs, gallops ABDOMEN: gravid, nontender, EFW 6#12oz by Leopolds EXTREMITIES:  Warm and well-perfused, nontender, nonedematous, *1+ DTRs neg clonus CERVIX: see nursing notes   FHT:140s baseline with posvariability +accelerations and no  decelerations  Toco: no UC's   DIAGNOSTIC STUDIES:  Recent Labs Lab 01/02/16 2022  WBC 15.8*  HGB 13.6  HCT 38.5  PLT 228    PRENATAL STUDIES:  Prenatal Labs:  MBT:O pos; Rubella immune, Varicella immune, HIV neg, RPR neg, Hep B neg, GC/CT neg, GBS pos, glucola WNL  Last US , AF wnl, normal anatomy at 20 weeks  ASSESSMENT AND PLAN:  1. Fetal Well being  - Fetal Tracing: Cat I - Ultrasound:  reviewed, as above - Group B Streptococcus: pos - Presentation: vtx confirmed by RN  2. Routine OB: - Prenatal labs reviewed, as above - Rh O pos  3. Induction of Labor:  -  Contractions :external toco in place -  Pelvis proven to none -  Plan for induction with Pitocin   4. Post Partum Planning: - Infant feeding: Breast/Bottle - Contraception: unknown

## 2016-01-03 NOTE — Anesthesia Preprocedure Evaluation (Signed)
Anesthesia Evaluation  Patient identified by MRN, date of birth, ID band Patient awake    Reviewed: Allergy & Precautions, H&P , NPO status , Patient's Chart, lab work & pertinent test results, reviewed documented beta blocker date and time   History of Anesthesia Complications Negative for: history of anesthetic complications  Airway Mallampati: III  TM Distance: >3 FB Neck ROM: full    Dental no notable dental hx. (+) Teeth Intact   Pulmonary neg pulmonary ROS,    Pulmonary exam normal breath sounds clear to auscultation       Cardiovascular Exercise Tolerance: Good negative cardio ROS Normal cardiovascular exam Rhythm:regular Rate:Normal     Neuro/Psych negative neurological ROS  negative psych ROS   GI/Hepatic negative GI ROS, Neg liver ROS,   Endo/Other  negative endocrine ROS  Renal/GU negative Renal ROS  negative genitourinary   Musculoskeletal   Abdominal   Peds  Hematology negative hematology ROS (+)   Anesthesia Other Findings Past Medical History:   Medical history non-contributory                             Reproductive/Obstetrics                             Anesthesia Physical Anesthesia Plan  ASA: II  Anesthesia Plan: Epidural   Post-op Pain Management:    Induction:   Airway Management Planned:   Additional Equipment:   Intra-op Plan:   Post-operative Plan:   Informed Consent: I have reviewed the patients History and Physical, chart, labs and discussed the procedure including the risks, benefits and alternatives for the proposed anesthesia with the patient or authorized representative who has indicated his/her understanding and acceptance.   Dental Advisory Given  Plan Discussed with: Anesthesiologist, CRNA and Surgeon  Anesthesia Plan Comments:         Anesthesia Quick Evaluation

## 2016-01-03 NOTE — Anesthesia Procedure Notes (Signed)
Epidural Patient location during procedure: OB Start time: 01/03/2016 6:14 AM End time: 01/03/2016 6:23 AM  Staffing Anesthesiologist: Lenard SimmerKARENZ, Teanna Elem Performed by: anesthesiologist   Preanesthetic Checklist Completed: patient identified, site marked, surgical consent, pre-op evaluation, timeout performed, IV checked, risks and benefits discussed and monitors and equipment checked  Epidural Patient position: sitting Prep: Betadine Patient monitoring: heart rate, continuous pulse ox and blood pressure Approach: midline Location: L4-L5 Injection technique: LOR saline  Needle:  Needle type: Tuohy  Needle gauge: 17 G Needle length: 9 cm and 9 Needle insertion depth: 5 cm Catheter type: closed end flexible Catheter size: 19 Gauge Catheter at skin depth: 9 cm Test dose: negative and 1.5% lidocaine with Epi 1:200 K  Assessment Events: blood not aspirated, injection not painful, no injection resistance, negative IV test and no paresthesia  Additional Notes   Patient tolerated the insertion well without complications.Reason for block:procedure for pain

## 2016-01-04 LAB — RPR: RPR Ser Ql: NONREACTIVE

## 2016-01-04 LAB — CBC
HEMATOCRIT: 31.7 % — AB (ref 35.0–47.0)
HEMOGLOBIN: 11 g/dL — AB (ref 12.0–16.0)
MCH: 32 pg (ref 26.0–34.0)
MCHC: 34.7 g/dL (ref 32.0–36.0)
MCV: 92.1 fL (ref 80.0–100.0)
Platelets: 194 10*3/uL (ref 150–440)
RBC: 3.44 MIL/uL — AB (ref 3.80–5.20)
RDW: 13.9 % (ref 11.5–14.5)
WBC: 16.9 10*3/uL — AB (ref 3.6–11.0)

## 2016-01-04 MED ORDER — NORETHINDRONE 0.35 MG PO TABS: 1.0000 | ORAL_TABLET | Freq: Every day | ORAL | Status: DC

## 2016-01-04 MED ORDER — IBUPROFEN 600 MG PO TABS: 600.0000 mg | ORAL_TABLET | Freq: Four times a day (QID) | ORAL | Status: DC

## 2016-01-04 MED ORDER — BENZOCAINE-MENTHOL 20-0.5 % EX AERO: 1.0000 "application " | INHALATION_SPRAY | CUTANEOUS | Status: DC | PRN

## 2016-01-04 MED ORDER — IBUPROFEN 600 MG PO TABS
600.0000 mg | ORAL_TABLET | Freq: Four times a day (QID) | ORAL | Status: DC
  Administered 2016-01-05 (×2): 600 mg via ORAL
  Filled 2016-01-04 (×2): qty 1

## 2016-01-04 NOTE — Discharge Summary (Signed)
Obstetric Discharge Summary Reason for Admission: onset of labor Prenatal Procedures: none Intrapartum Procedures: spontaneous vaginal delivery Postpartum Procedures: none Complications-Operative and Postpartum: none HEMOGLOBIN  Date Value Ref Range Status  01/04/2016 11.0* 12.0 - 16.0 g/dL Final   HCT  Date Value Ref Range Status  01/04/2016 31.7* 35.0 - 47.0 % Final    Physical Exam:  General: alert and cooperative Lochia: appropriate Uterine Fundus: firm Incision: n/a DVT Evaluation: No evidence of DVT seen on physical exam. Lungs CTA , CV RRR  Discharge Diagnoses: Term Pregnancy-delivered  Discharge Information: Date: 01/04/2016 Activity: pelvic rest Diet: routine Medications: Ibuprofen and micronor , dermaplast Condition: stable Instructions: refer to practice specific booklet Discharge to: home Follow-up Information    Follow up with [facility] In 6 weeks.   Why:  postpartum care   Contact information:   [address] [place] [phone] [phone]       Newborn Data: Live born female  Birth Weight: 8 lb 3.6 oz (3730 g) APGAR: 8, 9  Home with mother.  SCHERMERHORN,THOMAS 01/04/2016, 7:52 AM

## 2016-01-04 NOTE — Anesthesia Postprocedure Evaluation (Signed)
Anesthesia Post Note  Patient: Pamela Owen  Procedure(s) Performed: * No procedures listed *  Patient location during evaluation: Mother Baby Anesthesia Type: Epidural Level of consciousness: awake, awake and alert and oriented Pain management: pain level controlled Vital Signs Assessment: post-procedure vital signs reviewed and stable Respiratory status: spontaneous breathing, nonlabored ventilation and respiratory function stable Cardiovascular status: blood pressure returned to baseline and stable Postop Assessment: no backache, no headache and adequate PO intake Anesthetic complications: no    Last Vitals:  Filed Vitals:   01/04/16 0045 01/04/16 0343  BP: 103/67 105/60  Pulse: 81 79  Temp: 36.9 C 36.6 C  Resp: 20 20    Last Pain:  Filed Vitals:   01/04/16 0556  PainSc: 0-No pain                 Ginger CarneStephanie Samarrah Tranchina

## 2016-01-05 LAB — SURGICAL PATHOLOGY

## 2016-01-05 MED ORDER — NORETHINDRONE 0.35 MG PO TABS: 1.0000 | ORAL_TABLET | Freq: Every day | ORAL | Status: DC

## 2016-01-05 MED ORDER — IBUPROFEN 600 MG PO TABS: 600.0000 mg | ORAL_TABLET | Freq: Four times a day (QID) | ORAL | Status: DC

## 2016-01-05 NOTE — Discharge Summary (Signed)
Obstetric Discharge Summary Reason for Admission: onset of labor Prenatal Procedures: none Intrapartum Procedures: spontaneous vaginal delivery Postpartum Procedures: none Complications-Operative and Postpartum: none HEMOGLOBIN  Date Value Ref Range Status  01/04/2016 11.0* 12.0 - 16.0 g/dL Final   HCT  Date Value Ref Range Status  01/04/2016 31.7* 35.0 - 47.0 % Final    Physical Exam:  General: alert and cooperative Lochia: appropriate Uterine Fundus: firm Incision: n/a DVT Evaluation: No evidence of DVT seen on physical exam.  Discharge Diagnoses: Term Pregnancy-delivered  Discharge Information: Date: 01/05/2016 Activity: unrestricted Diet: routine Medications: None Condition: stable Instructions: refer to practice specific booklet Discharge to: home Follow-up Information    Follow up with [facility]. Schedule an appointment as soon as possible for a visit in 6 weeks.   Why:  for postpartum check up    Contact information:   [address] [place] [phone] [phone]       Follow up with [facility] In 6 weeks.   Why:  postpartum care   Contact information:   [address] [place] [phone]       Newborn Data: Live born female  Birth Weight: 8 lb 3.6 oz (3730 g) APGAR: 8, 9  Home with mother.  Pamela Owen 01/05/2016, 9:02 AM

## 2016-01-05 NOTE — Progress Notes (Signed)
Patient discharged home with infant and significant other. Discharge instructions, prescriptions and follow up appointment given to and reviewed with patient and significant other with interpreter present. Patient verbalized understanding. Escorted out via wheelchair by axiliary. 

## 2016-01-08 MED ORDER — BENZOCAINE-MENTHOL 20-0.5 % EX AERO
INHALATION_SPRAY | CUTANEOUS | Status: AC
  Filled 2016-01-08: qty 56

## 2016-01-29 MED ORDER — HYDROMORPHONE HCL 1 MG/ML IJ SOLN
INTRAMUSCULAR | Status: AC
  Filled 2016-01-29: qty 1

## 2016-02-06 MED ORDER — FENTANYL 2.5 MCG/ML W/ROPIVACAINE 0.2% IN NS 100 ML EPIDURAL INFUSION (ARMC-ANES)
EPIDURAL | Status: AC
  Filled 2016-02-06: qty 100

## 2016-02-21 MED ORDER — OXYTOCIN 40 UNITS IN LACTATED RINGERS INFUSION - SIMPLE MED
INTRAVENOUS | Status: AC
  Filled 2016-02-21: qty 1000

## 2016-02-24 MED ORDER — MISOPROSTOL 200 MCG PO TABS
ORAL_TABLET | ORAL | Status: AC
  Filled 2016-02-24: qty 4

## 2016-02-24 MED ORDER — LIDOCAINE HCL (PF) 1 % IJ SOLN
INTRAMUSCULAR | Status: AC
Start: 2016-02-24 — End: 2016-02-24
  Filled 2016-02-24: qty 30

## 2016-02-24 MED ORDER — AMMONIA AROMATIC IN INHA
RESPIRATORY_TRACT | Status: AC
  Filled 2016-02-24: qty 10

## 2016-02-24 MED ORDER — OXYTOCIN 10 UNIT/ML IJ SOLN
INTRAMUSCULAR | Status: AC
  Filled 2016-02-24: qty 2

## 2016-03-14 MED ORDER — SODIUM CHLORIDE FLUSH 0.9 % IV SOLN
INTRAVENOUS | Status: AC
  Filled 2016-03-14: qty 30

## 2016-03-27 MED ORDER — OXYTOCIN 40 UNITS IN LACTATED RINGERS INFUSION - SIMPLE MED
INTRAVENOUS | Status: AC
  Filled 2016-03-27: qty 1000

## 2016-07-06 IMAGING — US US MFM OB DETAIL+14 WK
1 series · 14 of 28 positions shown · non-contrast
Comparison: none

[Series 1: us mfm ob detail+14 wk · 0.23mm/px · 63 acquisitions, 14 frames shown]
[im 3/63]
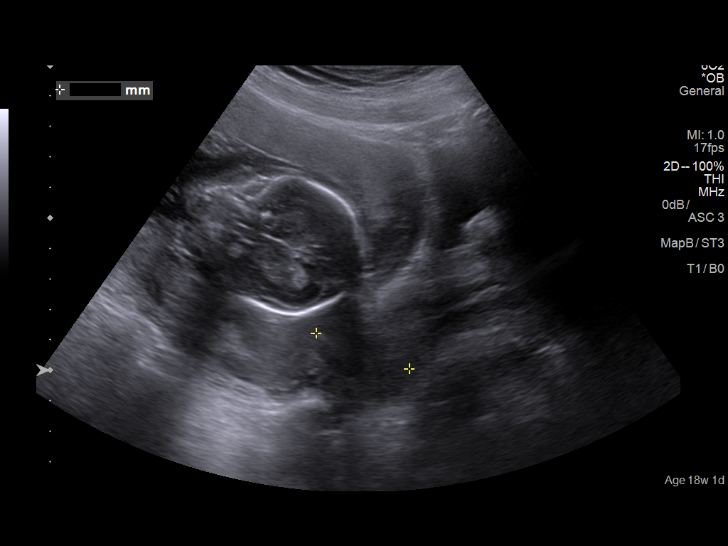
[im 7/63]
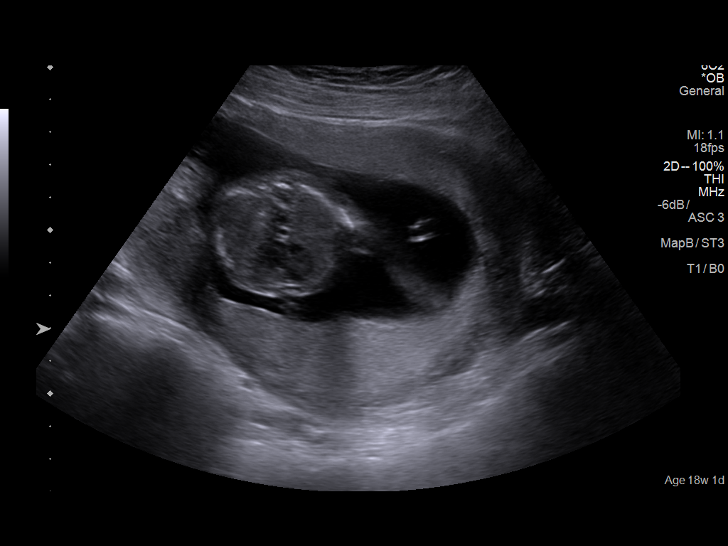
[im 12/63]
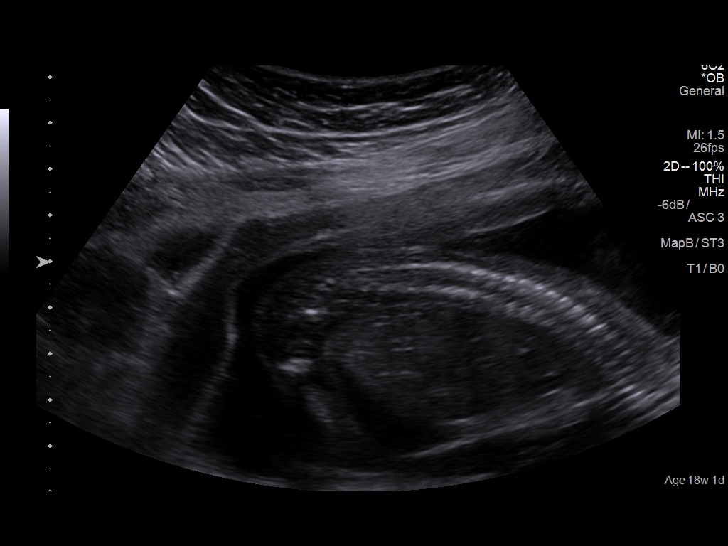
[im 17/63]
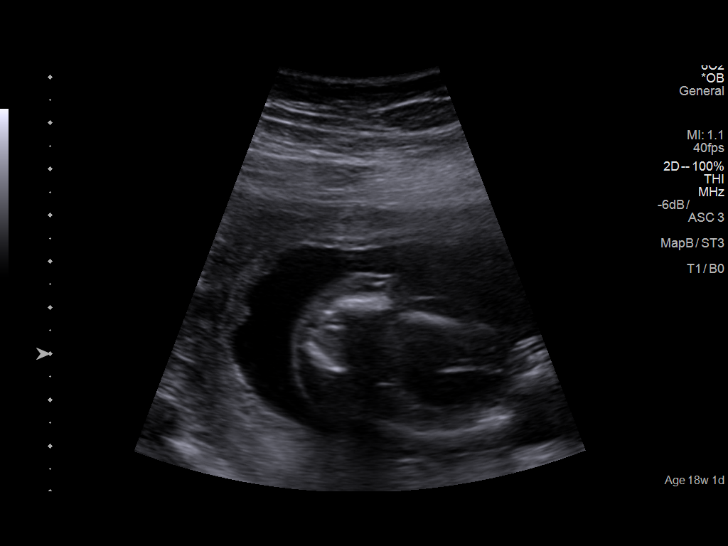
[im 21/63]
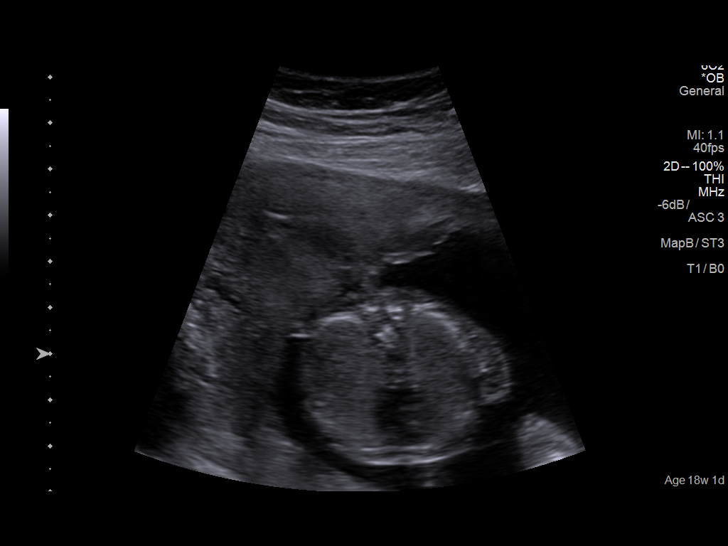
[im 26/63]
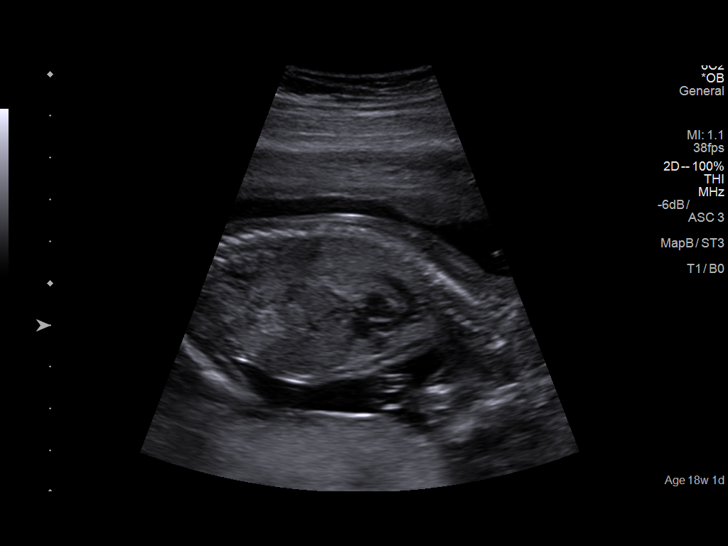
[im 30/63]
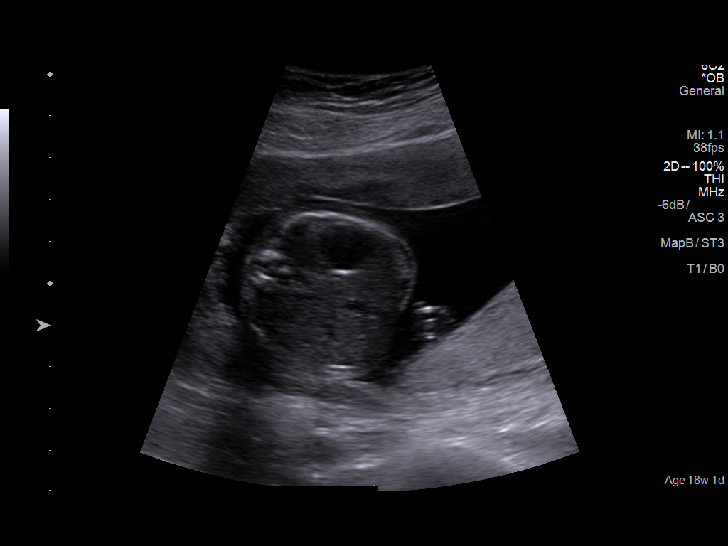
[im 35/63]
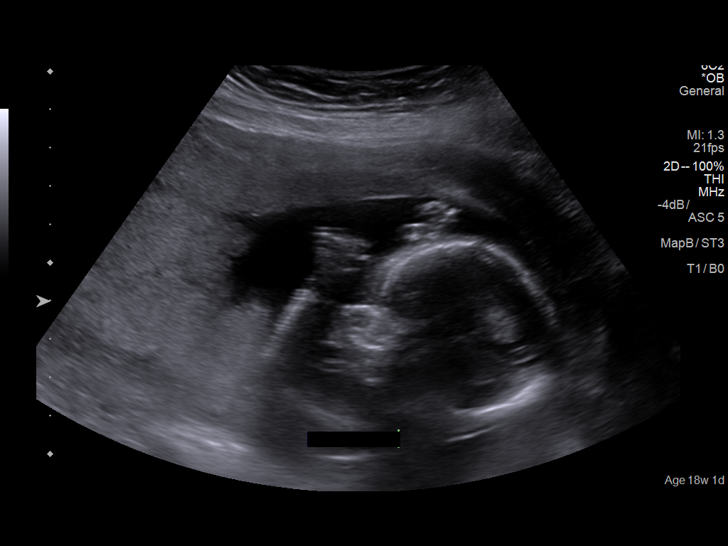
[im 40/63]
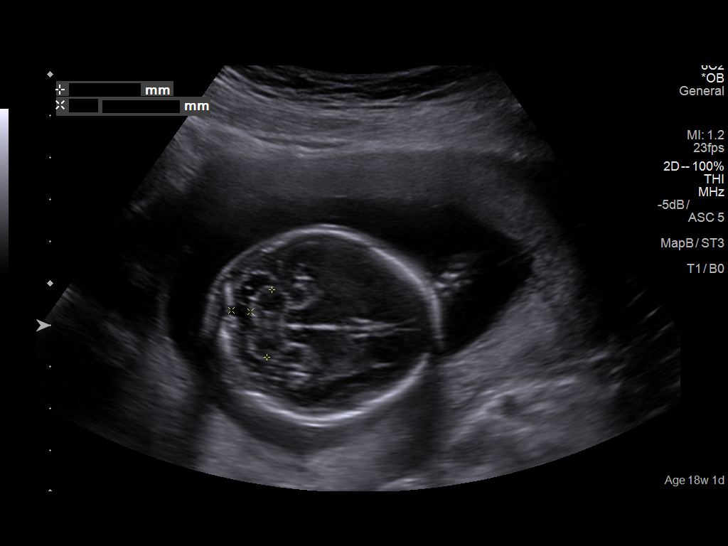
[im 44/63]
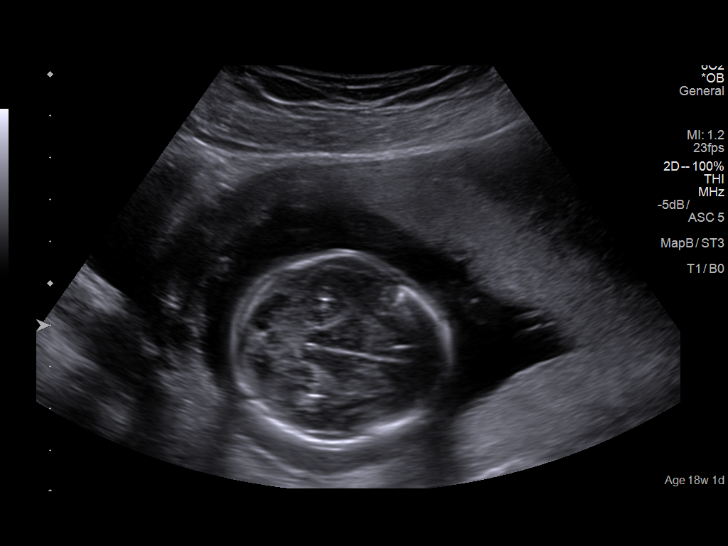
[im 49/63]
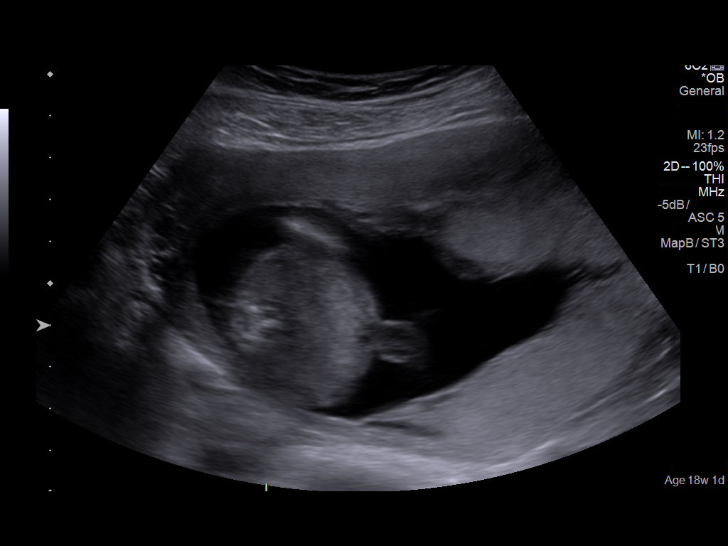
[im 53/63]
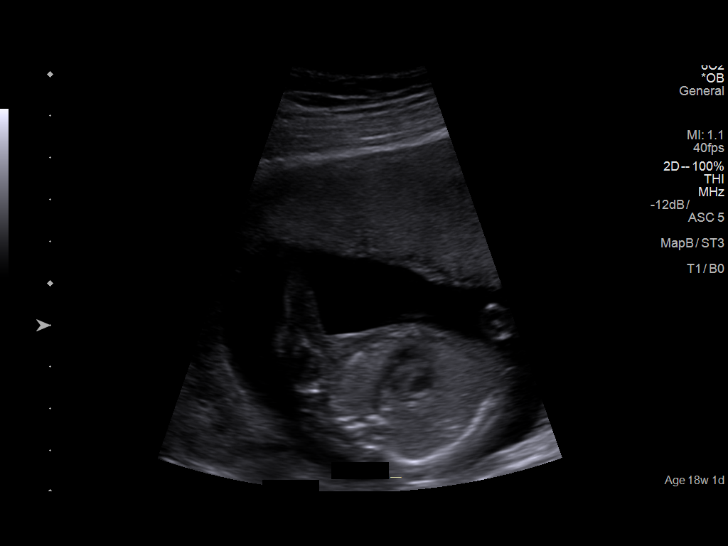
[im 58/63]
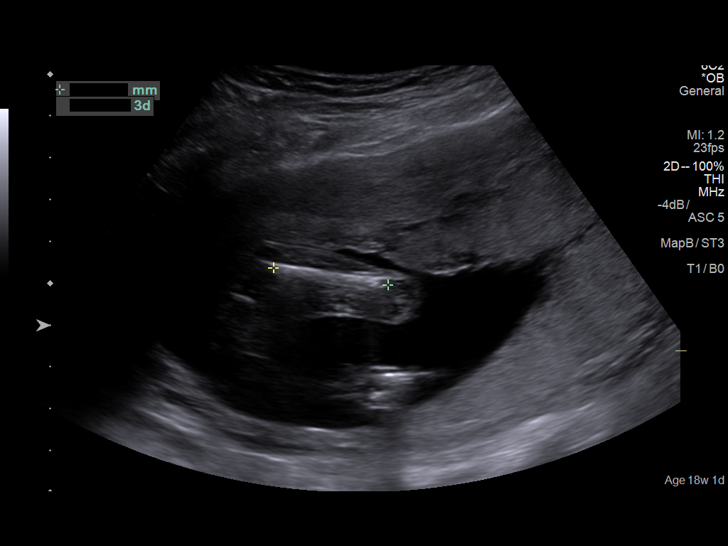
[im 63/63]
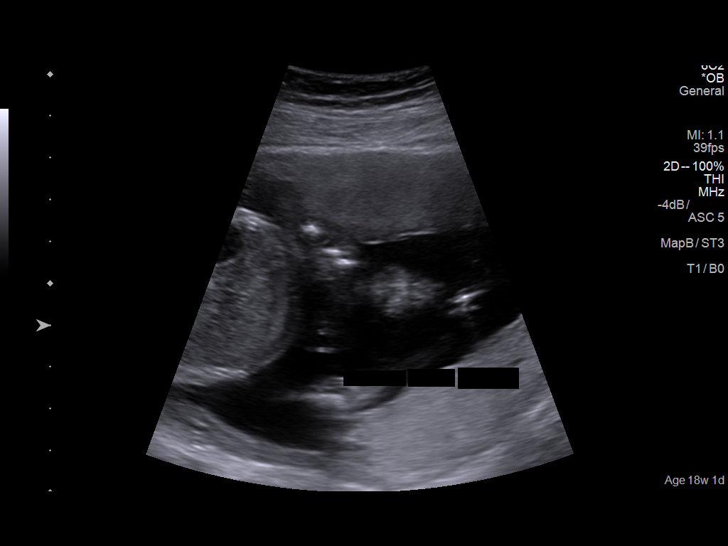

[14 of 28 positions shown; findings below may reference images not displayed]

Canned report from images found in remote index.

Refer to host system for actual result text.

## 2018-06-03 LAB — OB RESULTS CONSOLE HIV ANTIBODY (ROUTINE TESTING): HIV: NONREACTIVE

## 2018-06-03 LAB — OB RESULTS CONSOLE HGB/HCT, BLOOD: Hemoglobin: 13.4

## 2018-06-04 LAB — OB RESULTS CONSOLE VARICELLA ZOSTER ANTIBODY, IGG: Varicella: IMMUNE

## 2018-06-04 LAB — OB RESULTS CONSOLE HGB/HCT, BLOOD
HCT: 37 (ref 29–41)
Hemoglobin: 13.2

## 2018-06-04 LAB — OB RESULTS CONSOLE ABO/RH: RH Type: POSITIVE

## 2018-06-04 LAB — OB RESULTS CONSOLE ANTIBODY SCREEN: Antibody Screen: NEGATIVE

## 2018-06-04 LAB — OB RESULTS CONSOLE RUBELLA ANTIBODY, IGM: Rubella: IMMUNE

## 2018-06-04 LAB — OB RESULTS CONSOLE PLATELET COUNT: Platelets: 256

## 2018-06-04 LAB — OB RESULTS CONSOLE RPR: RPR: NONREACTIVE

## 2018-06-04 LAB — OB RESULTS CONSOLE HEPATITIS B SURFACE ANTIGEN: Hepatitis B Surface Ag: NEGATIVE

## 2018-06-06 LAB — OB RESULTS CONSOLE GC/CHLAMYDIA
Chlamydia: NEGATIVE
Gonorrhea: NEGATIVE

## 2018-10-21 LAB — OB RESULTS CONSOLE HIV ANTIBODY (ROUTINE TESTING): HIV: NONREACTIVE

## 2018-10-21 LAB — OB RESULTS CONSOLE HGB/HCT, BLOOD: Hemoglobin: 11.9

## 2018-10-22 LAB — OB RESULTS CONSOLE RPR
RPR: NONREACTIVE
RPR: NONREACTIVE

## 2018-10-22 LAB — OB RESULTS CONSOLE HGB/HCT, BLOOD: Hemoglobin: 11.9

## 2018-10-22 LAB — OB RESULTS CONSOLE HIV ANTIBODY (ROUTINE TESTING): HIV: NONREACTIVE

## 2018-12-18 LAB — OB RESULTS CONSOLE GBS: GBS: NEGATIVE

## 2018-12-18 LAB — OB RESULTS CONSOLE GC/CHLAMYDIA
Chlamydia: NEGATIVE
Gonorrhea: NEGATIVE

## 2018-12-19 DIAGNOSIS — Z3483 Encounter for supervision of other normal pregnancy, third trimester: Secondary | ICD-10-CM | POA: Insufficient documentation

## 2018-12-22 ENCOUNTER — Ambulatory Visit: Payer: Self-pay | Admitting: Family Medicine

## 2018-12-22 ENCOUNTER — Other Ambulatory Visit: Payer: Self-pay

## 2018-12-22 DIAGNOSIS — Z3483 Encounter for supervision of other normal pregnancy, third trimester: Secondary | ICD-10-CM

## 2018-12-22 NOTE — Progress Notes (Signed)
Phone call initiated to begin [facility] Tele-health appt. Verified speaking with correct client using 2 identifiers. Client is not at home address, but states is okay to proceed with visit. Negative responses to Covid-19 screening questions. Denies international travel for self or FOB since pregnant. Phone call to Dr. Ernestina Patches and left message client available for call.

## 2018-12-22 NOTE — Progress Notes (Signed)
TELEPHONE OBSTETRICS VISIT ENCOUNTER NOTE  I connected with Pamela Owen  on 12/22/18 at  3:40 PM EDT by telephone at home and verified that I am speaking with the correct person using two identifiers.   I discussed the limitations, risks, security and privacy concerns of performing an evaluation and management service by telephone and the availability of in person appointments. I also discussed with the patient that there may be a patient responsible charge related to this service. The patient expressed understanding and agreed to proceed.  Subjective:  Pamela Owen is a 28 y.o. G3P1011 at [redacted]w[redacted]d being followed for ongoing prenatal care.  She is currently monitored for the following issues for this low-risk pregnancy and has Labor and delivery, indication for care; Supervision of normal pregnancy in third trimester; Normal spontaneous vaginal delivery; and Supervision of normal intrauterine pregnancy in multigravida, third trimester on their problem list.  Patient reports no complaints. Reports fetal movement. Denies any contractions, bleeding or leaking of fluid.   The following portions of the patient's history were reviewed and updated as appropriate: allergies, current medications, past family history, past medical history, past social history, past surgical history and problem list.   Objective:   General:  Alert, oriented and cooperative.   Mental Status: Normal mood and affect perceived. Normal judgment and thought content.  Rest of physical exam deferred due to type of encounter  Assessment and Plan:  Pregnancy: G3P1011 at [redacted]w[redacted]d  1. Supervision of normal intrauterine pregnancy in multigravida, third trimester Up to date Has BP Cuff Discussed labor and [facility] visitor precautions  Term labor symptoms and general obstetric precautions including but not limited to vaginal bleeding, contractions, leaking of fluid and fetal movement were reviewed in detail  with the patient.  I discussed the assessment and treatment plan with the patient. The patient was provided an opportunity to ask questions and all were answered. The patient agreed with the plan and demonstrated an understanding of the instructions. The patient was advised to call back or seek an in-person office evaluation/go to the hospital for any urgent or concerning symptoms.  Please refer to After Visit Summary for other counseling recommendations.   I provided 12 minutes of non-face-to-face time during this encounter.  Return in about 1 week (around 12/29/2018) for Routine prenatal care, prefers in person because she had a young child at home.  No future appointments.  Caren Macadam, MD

## 2018-12-30 ENCOUNTER — Ambulatory Visit: Payer: Self-pay | Admitting: Nurse Practitioner

## 2018-12-30 ENCOUNTER — Encounter: Payer: Self-pay | Admitting: Nurse Practitioner

## 2018-12-30 ENCOUNTER — Other Ambulatory Visit: Payer: Self-pay

## 2018-12-30 DIAGNOSIS — Z3483 Encounter for supervision of other normal pregnancy, third trimester: Secondary | ICD-10-CM

## 2018-12-30 NOTE — Patient Instructions (Signed)
Mareos Dizziness Los mareos son un problema muy frecuente. Se trata de una sensacin de inestabilidad o desvanecimiento. Puede sentir que se va a desmayar. Los [employer] pueden provocarle una lesin si se tropieza o se cae. Las Engineer, manufacturing de todas las edades pueden sufrir Tree surgeon, Armed forces training and education officer es ms frecuente en los adultos [place]. Esta afeccin puede tener muchas causas, entre las que se pueden [employer], la deshidratacin y [place]. Siga estas indicaciones en su casa: Comida y bebida  Beba suficiente lquido como para mantener la orina clara o de color amarillo plido. Esto evita la deshidratacin. Trate de beber ms lquidos transparentes, como agua.  No beba alcohol.  Limite el consumo de cafena si el mdico se lo indica. Verifique los ingredientes y la informacin nutricional para saber si un alimento o una bebida contienen cafena.  Limite el consumo de sal (sodio) si el mdico se lo indica. Verifique los ingredientes y la informacin nutricional para saber si un alimento o una bebida contienen sodio. Actividad  Evite los movimientos rpidos. ? Levntese de las sillas con lentitud y apyese hasta sentirse bien. ? Por la maana, sintese primero a un lado de la cama. Cuando se sienta bien, pngase lentamente de pie mientras se sostiene de algo, hasta que sepa que ha logrado el equilibrio.  Mueva las piernas con frecuencia si debe estar de pie en un lugar durante mucho tiempo. Mientras est de pie, contraiga y relaje los msculos de las piernas.  No conduzca vehculos ni opere maquinaria pesada si se siente mareado.  Evite agacharse si se siente mareado. En su casa, coloque los objetos de modo que le resulte fcil alcanzarlos sin Office manager. Estilo de vida  No consuma ningn producto que contenga nicotina o tabaco, como cigarrillos y Psychologist, sport and exercise. Si necesita ayuda para dejar de fumar, consulte al [employer].  Trate de reducir el nivel de estrs con mtodos como  el yoga o la meditacin. Hable con el mdico si necesita ayuda para controlar el nivel de estrs. Instrucciones generales  Controle sus mareos para ver si hay cambios.  Tome los medicamentos de venta libre y los recetados solamente como se lo haya indicado el mdico. Hable con el mdico si cree que los medicamentos que est tomando son la causa de sus mareos.  Infrmele a un amigo o a un familiar si se siente mareado. Pdale a esta persona que llame al mdico si observa cambios en su comportamiento.  Concurra a todas las visitas de seguimiento como se lo haya indicado el mdico. Esto es importante. Comunquese con un mdico si:  Los [employer].  Los [employer] o la sensacin de Engineer, petroleum.  Siente nuseas.  Se le redujo la audicin.  Aparecen nuevos sntomas.  Cuando est de pie, se siente inestable o que la habitacin da vueltas. Solicite ayuda de inmediato si:  Vomita o tiene diarrea y no puede comer ni beber nada.  Tiene dificultad para hablar, caminar, tragar o [employer], las [place].  Se siente usualmente dbil.  No piensa con claridad o tiene dificultad para armar oraciones. Es posible que un amigo o un familiar adviertan que esto ocurre.  Tiene dolor de pecho, dolor abdominal, sudoracin o Risk manager.  Sufre cambios en la visin.  Tiene cualquier tipo de sangrado.  Tiene dolor de cabeza intenso.  Tiene dolor o rigidez en el cuello.  Tiene fiebre. Estos sntomas pueden representar un problema grave que constituye Engineer, maintenance (IT). No espere hasta que los  sntomas desaparezcan. Solicite atencin mdica de inmediato. Comunquese con el servicio de emergencias de su localidad (911 en los [place]). No conduzca por sus propios medios Principal Financial. Resumen  Los mareos son Ardelia Mems sensacin de inestabilidad o desvanecimiento. Esta afeccin puede tener muchas causas, entre las que se pueden [employer], la  deshidratacin y [place].  Las Engineer, manufacturing de todas las edades pueden sufrir Tree surgeon, Armed forces training and education officer es ms frecuente en los adultos [place].  Beba suficiente lquido como para mantener la orina clara o de color amarillo plido. No beba alcohol.  Evite los movimientos rpidos si se siente mareado. Controle sus mareos para ver si hay cambios. Esta informacin no tiene Marine scientist el consejo del mdico. Asegrese de hacerle al mdico cualquier pregunta que tenga. Document Released: 06/11/2005 Document Revised: 09/28/2016 Document Reviewed: 09/28/2016 Elsevier Patient Education  [place].

## 2018-12-30 NOTE — Progress Notes (Signed)
    PRENATAL VISIT NOTE  Subjective:  Pamela Owen is a 28 y.o. G3P1011 at [redacted]w[redacted]d being seen today for ongoing prenatal care.  She is currently monitored for the following issues for this low-risk pregnancy and has Supervision of normal intrauterine pregnancy in multigravida, third trimester on their problem list.  Patient reports pelvic pressure.   .  .   . denies vomiting, abdominal pain, fussiness, diarrhea, cough and difficulty breathing leaking of fluid/ROM.   The following portions of the patient's history were reviewed and updated as appropriate: allergies, current medications, past family history, past medical history, past social history, past surgical history and problem list. Problem list updated.  Objective:   Vitals:   12/30/18 0841  BP: 110/76  Temp: (!) 97.5 F (36.4 C)  Weight: 201 lb 12.8 oz (91.5 kg)    Fetal Status:           General:  Alert, oriented and cooperative. Patient is in no acute distress.  Skin: Skin is warm and dry. No rash noted.   Cardiovascular: Normal heart rate noted  Respiratory: Normal respiratory effort, no problems with respiration noted  Abdomen: Soft, gravid, appropriate for gestational age.        Pelvic: Cervical exam deferred        Extremities: Normal range of motion.     Mental Status: Normal mood and affect. Normal behavior. Normal judgment and thought content.   Assessment and Plan:  Pregnancy: G3P1011 at [redacted]w[redacted]d  1. Supervision of normal intrauterine pregnancy in multigravida, third trimester Client admits to "doing well" Admits to wanting to continue care for delivery at [facility] pelvic pressure noted recently C/O slight dizziness when sitting up from lying down on table after examination - report dizziness during next MV if symtptoms continue Admits to drinking 2 bottles of water daily - advised to increase to 4 daily  Plan for cervical check dutring next MV appointment.  Client verbalizes understanding  and is in agreement with plan of care    Preterm labor symptoms and general obstetric precautions including but not limited to vaginal bleeding, contractions, leaking of fluid and fetal movement were reviewed in detail with the patient. Please refer to After Visit Summary for other counseling recommendations.  No follow-ups on file.  Future Appointments  Date Time Provider [place]  01/07/2019  8:40 AM [facility] PROVIDER [facility] None    Berniece Andreas, NP

## 2018-12-30 NOTE — Progress Notes (Signed)
38.3 weeks. Denies s/s or exposure to Covid-19, denies travel for self/partner, denies ED/hospital visits since last RV.

## 2019-01-01 DIAGNOSIS — Z3483 Encounter for supervision of other normal pregnancy, third trimester: Secondary | ICD-10-CM

## 2019-01-02 DIAGNOSIS — Z6740 Type O blood, Rh positive: Secondary | ICD-10-CM | POA: Insufficient documentation

## 2019-01-07 ENCOUNTER — Ambulatory Visit: Payer: Self-pay

## 2019-02-19 ENCOUNTER — Ambulatory Visit: Payer: Self-pay

## 2019-02-19 ENCOUNTER — Ambulatory Visit: Payer: Self-pay | Admitting: Physician Assistant

## 2019-02-19 ENCOUNTER — Other Ambulatory Visit: Payer: Self-pay

## 2019-02-19 VITALS — BP 114/75 | Ht 66.0 in | Wt 188.2 lb

## 2019-02-19 DIAGNOSIS — E669 Obesity, unspecified: Secondary | ICD-10-CM

## 2019-02-19 DIAGNOSIS — Z3009 Encounter for other general counseling and advice on contraception: Secondary | ICD-10-CM

## 2019-02-19 DIAGNOSIS — E66811 Obesity, class 1: Secondary | ICD-10-CM

## 2019-02-19 LAB — HEMOGLOBIN, FINGERSTICK: Hemoglobin: 13.9 g/dL (ref 11.1–15.9)

## 2019-02-19 MED ORDER — THERA VITAL M PO TABS: 1.0000 | ORAL_TABLET | Freq: Every day | ORAL | 0 refills | Status: AC

## 2019-02-19 NOTE — Progress Notes (Signed)
In for 6 wks. Post partum; taking PNV; breast/bottle feeding; declines HIV/RPR testing Debera Lat, RN

## 2019-02-19 NOTE — Progress Notes (Signed)
Post Partum Exam  Pamela Owen Pamela Owen is a 28 y.o. G35P1011 female who presents for a postpartum visit. She is 6 weeks postpartum following a spontaneous vaginal delivery. I have fully reviewed the prenatal and intrapartum course. The delivery was at 39 4/7 gestational weeks.  Anesthesia: nitrous oxide. Postpartum course has been uneventful. Baby's course has been uneventful. Baby is feeding by both breast and bottle - unknown type. Bleeding no bleeding. Bowel function is normal. Bladder function is normal. Patient is sexually active. Contraception method is abstinence.   Postpartum depression screening: Edinburgh Postnatal Depression Scale - 02/19/19 0957      Edinburgh Postnatal Depression Scale:  In the Past 7 Days   I have been able to laugh and see the funny side of things.  1    I have looked forward with enjoyment to things.  0    I have blamed myself unnecessarily when things went wrong.  0    I have been anxious or worried for no good reason.  0    I have felt scared or panicky for no good reason.  0    Things have been getting on top of me.  0    I have been so unhappy that I have had difficulty sleeping.  0    I have felt sad or miserable.  0    I have been so unhappy that I have been crying.  0    The thought of harming myself has occurred to me.  0    Edinburgh Postnatal Depression Scale Total  1         Last pap smear done 05/2018 and was Normal  Review of Systems see routine health survey    Objective:  BP 114/75   Ht 5\' 6"  (1.676 m)   Wt 188 lb 3.2 oz (85.4 kg)   LMP 03/28/2018 (Exact Date) Comment: No menses since delivery  Breastfeeding Yes   BMI 30.38 kg/m   General:  alert, cooperative, appears stated age and mildly obese   Breasts:  positive findings: milky bilat discharge and small supernumerary nipple below R breast  Lungs: clear to auscultation bilaterally  Heart:  regular rate and rhythm, S1, S2 normal, no murmur, click, rub or gallop   Abdomen: soft, non-tender; bowel sounds normal; no masses,  no organomegaly   Vulva:  normal  Vagina: normal vagina, no discharge, exudate, lesion, or erythema  Cervix:  multiparous appearance  Corpus: normal size, contour, position, consistency, mobility, non-tender  Adnexa:  no mass, fullness, tenderness  Rectal Exam: Not performed.        Assessment:    Normal postpartum exam. Pap smear not done at today's visit.  Hgb done today = 13.9 g/dL  Plan:   1. Contraception: rhythm method 2. Infant feeding:  patient is currently feeding with breast & bottle. 3. Mood: EPDS is low risk. Reviewed resources and that mood sx in first year after pregnancy are considered related to pregnancy and to reach out for help at [facility] if needed. Discussed [facility] as link to care and availability of LCSW for counseling  4. Chronic Medical Conditions:  Obesity - enc diet/exercise   Patient given handout about PCP care in the community Given MVI per family planning program  Follow up in: 1 year for routine well-woman care or as needed. Next Pap due 05/2021.

## 2019-05-12 ENCOUNTER — Other Ambulatory Visit: Payer: Self-pay

## 2019-05-12 DIAGNOSIS — Z20822 Contact with and (suspected) exposure to covid-19: Secondary | ICD-10-CM

## 2019-05-14 LAB — NOVEL CORONAVIRUS, NAA: SARS-CoV-2, NAA: DETECTED — AB

## 2019-09-21 ENCOUNTER — Ambulatory Visit: Payer: Self-pay
